# Patient Record
Sex: Male | Born: 1950 | Race: White | Hispanic: No | Marital: Married | Smoking: Never smoker
Health system: Southern US, Community
[De-identification: ages and names within clinical notes are randomized; demographics above are authoritative.]

## PROBLEM LIST (undated history)

## (undated) DIAGNOSIS — M419 Scoliosis, unspecified: Secondary | ICD-10-CM

## (undated) DIAGNOSIS — N529 Male erectile dysfunction, unspecified: Secondary | ICD-10-CM

## (undated) DIAGNOSIS — E669 Obesity, unspecified: Secondary | ICD-10-CM

## (undated) DIAGNOSIS — I471 Supraventricular tachycardia, unspecified: Secondary | ICD-10-CM

## (undated) DIAGNOSIS — E291 Testicular hypofunction: Secondary | ICD-10-CM

## (undated) HISTORY — DX: Supraventricular tachycardia: I47.1

## (undated) HISTORY — PX: NASAL SEPTUM SURGERY: SHX37

## (undated) HISTORY — PX: DENTAL SURGERY: SHX609

## (undated) HISTORY — DX: Supraventricular tachycardia, unspecified: I47.10

## (undated) HISTORY — DX: Obesity, unspecified: E66.9

## (undated) HISTORY — DX: Scoliosis, unspecified: M41.9

## (undated) HISTORY — DX: Male erectile dysfunction, unspecified: N52.9

## (undated) HISTORY — DX: Testicular hypofunction: E29.1

---

## 2000-08-30 ENCOUNTER — Other Ambulatory Visit: Admission: RE | Admit: 2000-08-30 | Discharge: 2000-08-30 | Payer: Self-pay | Admitting: Gastroenterology

## 2010-10-23 ENCOUNTER — Encounter: Payer: Self-pay | Admitting: Gastroenterology

## 2013-05-17 HISTORY — PX: COLONOSCOPY: SHX174

## 2013-08-03 ENCOUNTER — Encounter: Payer: Self-pay | Admitting: Gastroenterology

## 2013-09-13 ENCOUNTER — Encounter: Payer: Self-pay | Admitting: Gastroenterology

## 2013-10-22 ENCOUNTER — Ambulatory Visit (AMBULATORY_SURGERY_CENTER): Payer: Self-pay

## 2013-10-22 VITALS — Ht 72.0 in | Wt 231.0 lb

## 2013-10-22 DIAGNOSIS — Z860100 Personal history of colon polyps, unspecified: Secondary | ICD-10-CM

## 2013-10-22 MED ORDER — SUPREP BOWEL PREP KIT 17.5-3.13-1.6 GM/177ML PO SOLN: 1.0000 | Freq: Once | ORAL | Status: DC

## 2013-10-22 NOTE — Progress Notes (Signed)
No allergies to eggs or soy No home oxygen No diet/weight loss meds No past problems with anesthesia  Has email  Emmi instructions given for colonoscopy 

## 2013-10-25 ENCOUNTER — Encounter: Payer: Self-pay | Admitting: Gastroenterology

## 2013-11-05 ENCOUNTER — Encounter: Payer: Self-pay | Admitting: Gastroenterology

## 2013-11-05 ENCOUNTER — Ambulatory Visit (AMBULATORY_SURGERY_CENTER): Payer: BC Managed Care – PPO | Admitting: Gastroenterology

## 2013-11-05 VITALS — BP 113/73 | HR 74 | Temp 97.9°F | Resp 26 | Ht 72.0 in | Wt 231.0 lb

## 2013-11-05 DIAGNOSIS — Z1211 Encounter for screening for malignant neoplasm of colon: Secondary | ICD-10-CM

## 2013-11-05 DIAGNOSIS — K5730 Diverticulosis of large intestine without perforation or abscess without bleeding: Secondary | ICD-10-CM

## 2013-11-05 DIAGNOSIS — Z86010 Personal history of colonic polyps: Secondary | ICD-10-CM

## 2013-11-05 MED ORDER — SODIUM CHLORIDE 0.9 % IV SOLN: 500.0000 mL | INTRAVENOUS | Status: DC

## 2013-11-05 NOTE — Op Note (Signed)
[facility] [address] [place], [id number]   COLONOSCOPY PROCEDURE REPORT  PATIENT: Eric Sullivan, Eric W.  MR#: [id number] BIRTHDATE: 1950/06/24 , 63  yrs. old GENDER: Male ENDOSCOPIST: Louis Meckelobert D Kaplan, MD REFERRED BM:WUXLKBY:Edwin Chilton SiGreen, M.D. PROCEDURE DATE:  11/05/2013 PROCEDURE:   Colonoscopy, diagnostic First Screening Colonoscopy - Avg.  risk and is 50 yrs.  old or older - No.  Prior Negative Screening - Now for repeat screening. 10 or more years since last screening  History of Adenoma - Now for follow-up colonoscopy & has been > or = to 3 yrs.  Yes hx of adenoma.  Has been 3 or more years since last colonoscopy.  Polyps Removed Today? No.  Recommend repeat exam, <10 yrs? No. ASA CLASS:   Class II INDICATIONS:Patient's personal history of colon polyps 2002.  2005 colonoscopy negative for polyps MEDICATIONS: MAC sedation, administered by CRNA and propofol (Diprivan) 300mg  IV  DESCRIPTION OF PROCEDURE:   After the risks benefits and alternatives of the procedure were thoroughly explained, informed consent was obtained.  A digital rectal exam revealed no abnormalities of the rectum.   The LB [id number]  endoscope was introduced through the anus and advanced to the terminal ileum which was intubated for a short distance. No adverse events experienced.   The quality of the prep was excellent using Suprep The instrument was then slowly withdrawn as the colon was fully examined.      COLON FINDINGS: Mild diverticulosis was noted in the sigmoid colon. The mucosa appeared normal in the terminal ileum.   A normal appearing cecum, ileocecal valve, and appendiceal orifice were identified.  The ascending, hepatic flexure, transverse, splenic flexure, descending, sigmoid colon and rectum appeared unremarkable.  No polyps or cancers were seen.  Retroflexed views revealed no abnormalities. The time to cecum=6 minutes 11 seconds. Withdrawal time=7 minutes 20 seconds.   The scope was withdrawn and the procedure completed. COMPLICATIONS: There were no complications.  ENDOSCOPIC IMPRESSION: 1.   Mild diverticulosis was noted in the sigmoid colon 2.   Normal mucosa in the terminal ileum 3.   Normal colon  RECOMMENDATIONS: Colonoscopy 10 years   eSigned:  Louis Meckelobert D Kaplan, MD 11/05/2013 9:38 AM   cc:   PATIENT NAME:  Eric Sullivan, Eric W. MR#: [id number]

## 2013-11-05 NOTE — Progress Notes (Signed)
Procedure ends, to recovery, report given and VSS. 

## 2013-11-05 NOTE — Patient Instructions (Signed)
Discharge instructions given with verbal understanding. Handouts on diverticulosis and a high fiber diet. Resume previous medications. YOU HAD AN ENDOSCOPIC PROCEDURE TODAY AT THE [facility]: Refer to the procedure report that was given to you for any specific questions about what was found during the examination.  If the procedure report does not answer your questions, please call your gastroenterologist to clarify.  If you requested that your care partner not be given the details of your procedure findings, then the procedure report has been included in a sealed envelope for you to review at your convenience later.  YOU SHOULD EXPECT: Some feelings of bloating in the abdomen. Passage of more gas than usual.  Walking can help get rid of the air that was put into your GI tract during the procedure and reduce the bloating. If you had a lower endoscopy (such as a colonoscopy or flexible sigmoidoscopy) you may notice spotting of blood in your stool or on the toilet paper. If you underwent a bowel prep for your procedure, then you may not have a normal bowel movement for a few days.  DIET: Your first meal following the procedure should be a light meal and then it is ok to progress to your normal diet.  A half-sandwich or bowl of soup is an example of a good first meal.  Heavy or fried foods are harder to digest and may make you feel nauseous or bloated.  Likewise meals heavy in dairy and vegetables can cause extra gas to form and this can also increase the bloating.  Drink plenty of fluids but you should avoid alcoholic beverages for 24 hours.  ACTIVITY: Your care partner should take you home directly after the procedure.  You should plan to take it easy, moving slowly for the rest of the day.  You can resume normal activity the day after the procedure however you should NOT DRIVE or use heavy machinery for 24 hours (because of the sedation medicines used during the test).    SYMPTOMS TO REPORT  IMMEDIATELY: A gastroenterologist can be reached at any hour.  During normal business hours, 8:30 AM to 5:00 PM Monday through Friday, call [phone].  After hours and on weekends, please call the GI answering service at [phone] who will take a message and have the physician on call contact you.   Following lower endoscopy (colonoscopy or flexible sigmoidoscopy):  Excessive amounts of blood in the stool  Significant tenderness or worsening of abdominal pains  Swelling of the abdomen that is new, acute  Fever of 100F or higher FOLLOW UP: If any biopsies were taken you will be contacted by phone or by letter within the next 1-3 weeks.  Call your gastroenterologist if you have not heard about the biopsies in 3 weeks.  Our staff will call the home number listed on your records the next business day following your procedure to check on you and address any questions or concerns that you may have at that time regarding the information given to you following your procedure. This is a courtesy call and so if there is no answer at the home number and we have not heard from you through the emergency physician on call, we will assume that you have returned to your regular daily activities without incident.  SIGNATURES/CONFIDENTIALITY: You and/or your care partner have signed paperwork which will be entered into your electronic medical record.  These signatures attest to the fact that that the information above on your After   Visit Summary has been reviewed and is understood.  Full responsibility of the confidentiality of this discharge information lies with you and/or your care-partner. 

## 2013-11-06 ENCOUNTER — Telehealth: Payer: Self-pay | Admitting: *Deleted

## 2013-11-06 NOTE — Telephone Encounter (Signed)
  Follow up Call-  Call back number 11/05/2013  Post procedure Call Back phone  # [phone]  Permission to leave phone message Yes     Patient questions:  Do you have a fever, pain , or abdominal swelling? no Pain Score  0 *  Have you tolerated food without any problems? yes  Have you been able to return to your normal activities? yes  Do you have any questions about your discharge instructions: Diet   no Medications  no Follow up visit  no  Do you have questions or concerns about your Care? no  Actions: * If pain score is 4 or above: No action needed, pain <4.

## 2016-06-07 ENCOUNTER — Other Ambulatory Visit: Payer: Self-pay | Admitting: Orthopedic Surgery

## 2016-06-07 DIAGNOSIS — M25511 Pain in right shoulder: Secondary | ICD-10-CM

## 2016-06-13 ENCOUNTER — Ambulatory Visit
Admission: RE | Admit: 2016-06-13 | Discharge: 2016-06-13 | Disposition: A | Discharge status: Home / Self Care | Payer: BLUE CROSS/BLUE SHIELD | Source: Ambulatory Visit | Attending: Orthopedic Surgery | Admitting: Orthopedic Surgery

## 2016-06-13 DIAGNOSIS — M25511 Pain in right shoulder: Secondary | ICD-10-CM

## 2016-10-26 ENCOUNTER — Ambulatory Visit (INDEPENDENT_AMBULATORY_CARE_PROVIDER_SITE_OTHER): Payer: Medicare Other

## 2016-10-26 ENCOUNTER — Other Ambulatory Visit: Payer: Self-pay | Admitting: Internal Medicine

## 2016-10-26 DIAGNOSIS — R52 Pain, unspecified: Secondary | ICD-10-CM

## 2016-10-26 DIAGNOSIS — R002 Palpitations: Secondary | ICD-10-CM | POA: Diagnosis not present

## 2016-11-08 ENCOUNTER — Other Ambulatory Visit: Payer: Medicare Other

## 2018-03-13 IMAGING — MR MR SHOULDER*R* W/O CM
5 series · 36 of 40 positions shown · non-contrast
Comparison: None.

CLINICAL DATA: Right shoulder pain for 20 years increasing over the
past month.

EXAM:
MRI OF THE RIGHT SHOULDER WITHOUT CONTRAST
TECHNIQUE: Multiplanar, multisequence MR imaging of the shoulder was performed.
No intravenous contrast was administered.

[Series 5: T2 fat-sat · axial · 4.0mm · 0.44mm/px · z∈[-20,+53]mm · 9 of 18 slices shown (1 of 3)]
[im 1/18]
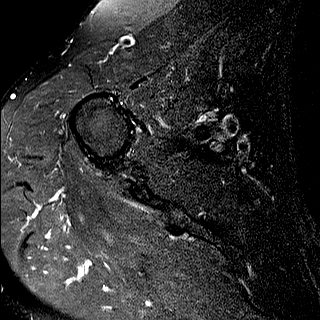
[im 3/18]
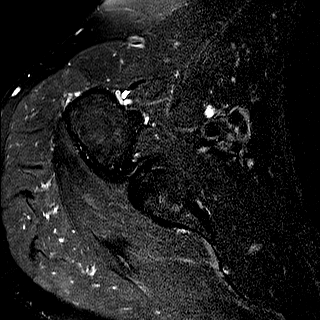
[im 5/18]
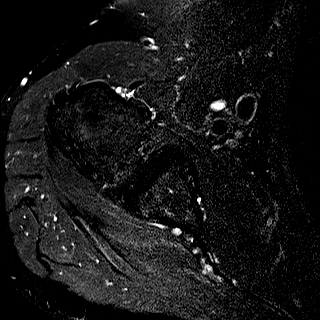
[im 7/18]
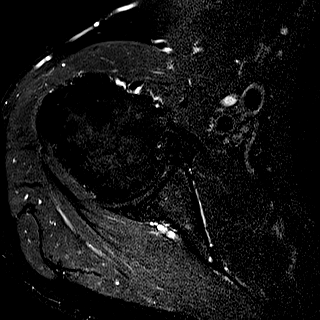
[im 9/18]
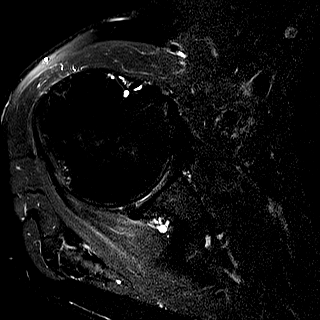
[im 11/18]
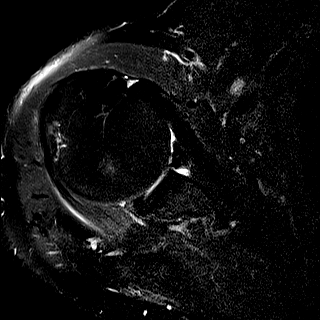
[im 13/18]
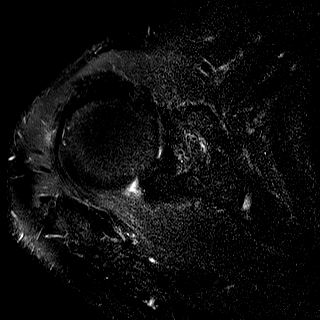
[im 15/18]
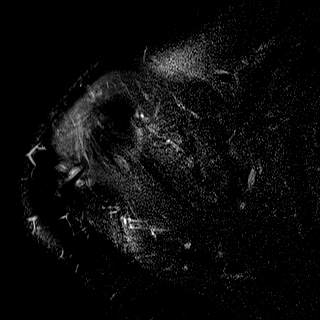
[im 18/18]
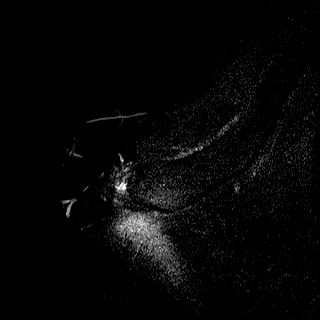

[Series 6: T2 fat-sat · oblique · 4.0mm · 0.50mm/px · 8 of 16 slices shown (2 of 3)]
[im 1/16]
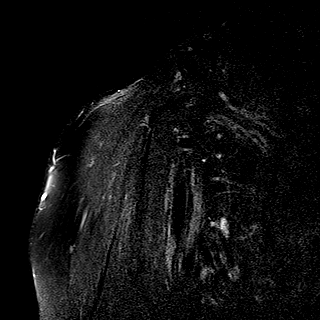
[im 3/16]
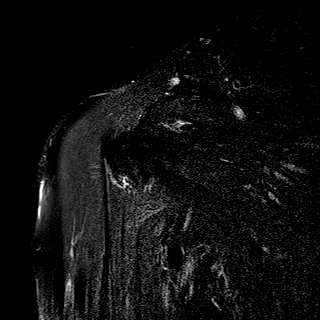
[im 5/16]
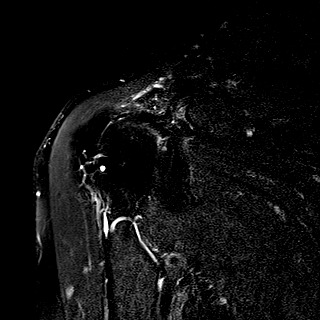
[im 7/16]
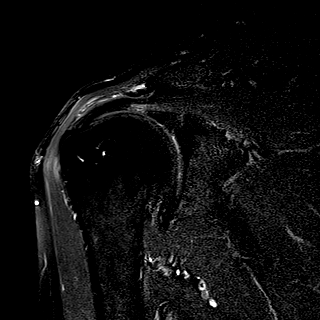
[im 9/16]
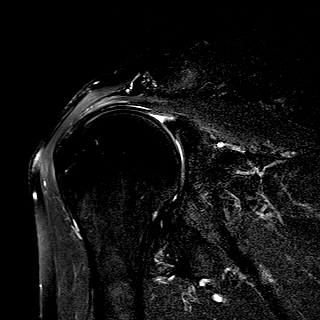
[im 11/16]
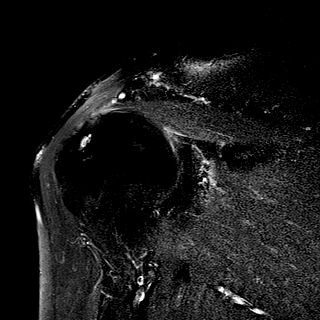
[im 13/16]
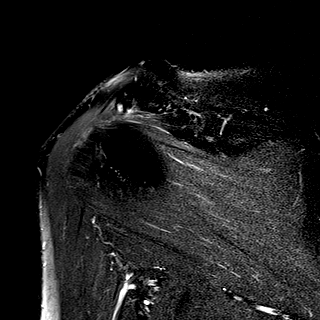
[im 16/16]
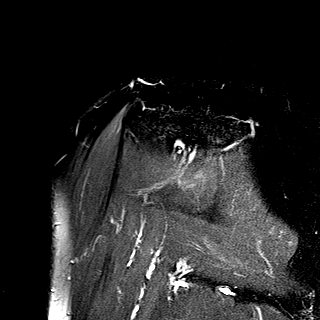

[Series 7: PD · oblique · 4.0mm · 0.50mm/px · 7 of 16 slices shown]
[im 1/16]
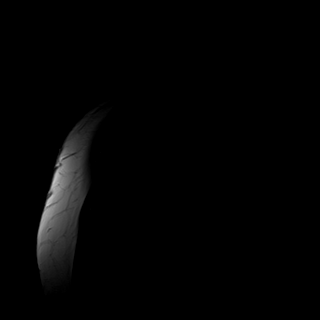
[im 3/16]
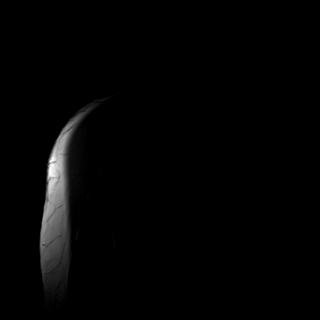
[im 6/16]
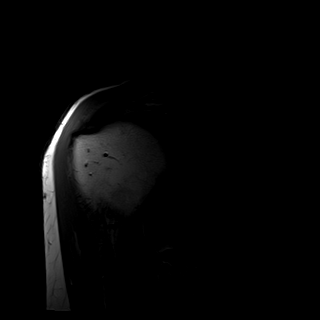
[im 8/16]
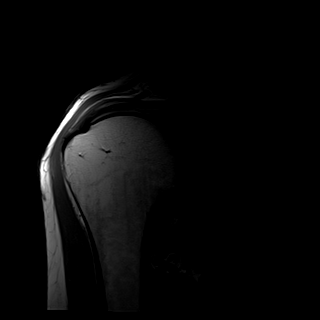
[im 11/16]
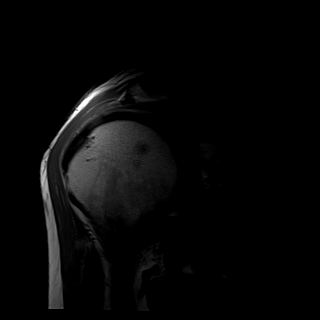
[im 13/16]
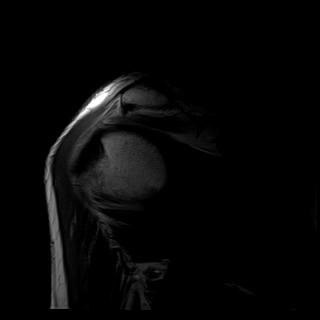
[im 16/16]
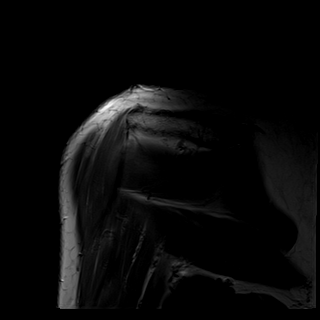

[Series 8: T2 fat-sat · coronal · 4.0mm · 0.50mm/px · 8 of 18 slices shown (3 of 3)]
[im 1/18]
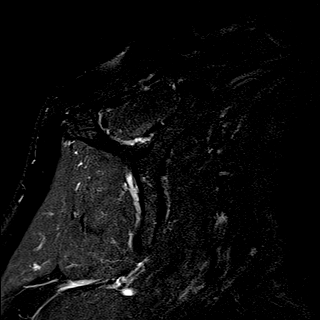
[im 3/18]
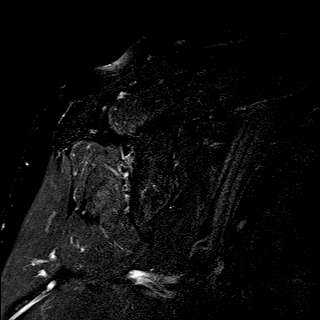
[im 5/18]
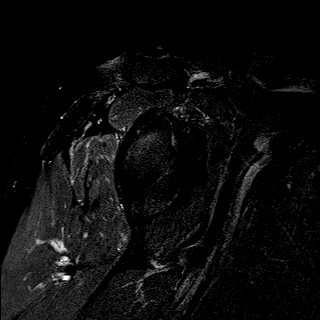
[im 8/18]
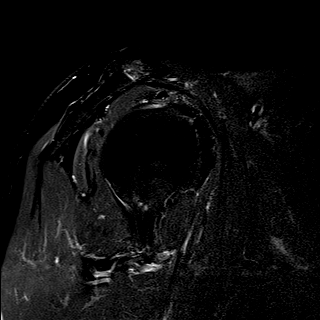
[im 10/18]
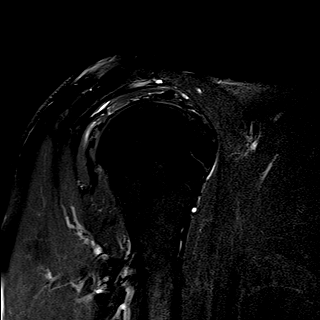
[im 13/18]
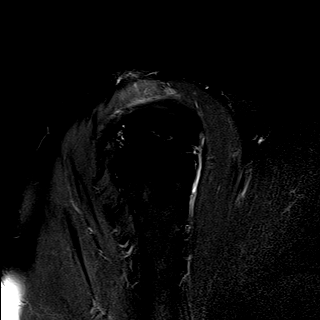
[im 15/18]
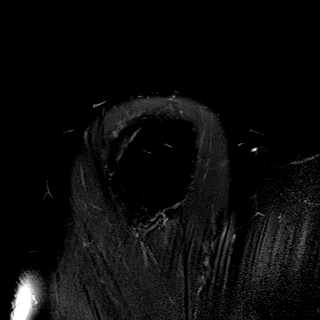
[im 18/18]
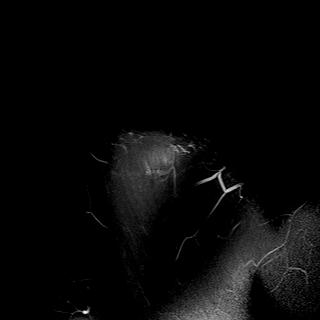

[Series 9: T1 · coronal · 4.0mm · 0.25mm/px · 4 of 18 slices shown]
[im 1/18]
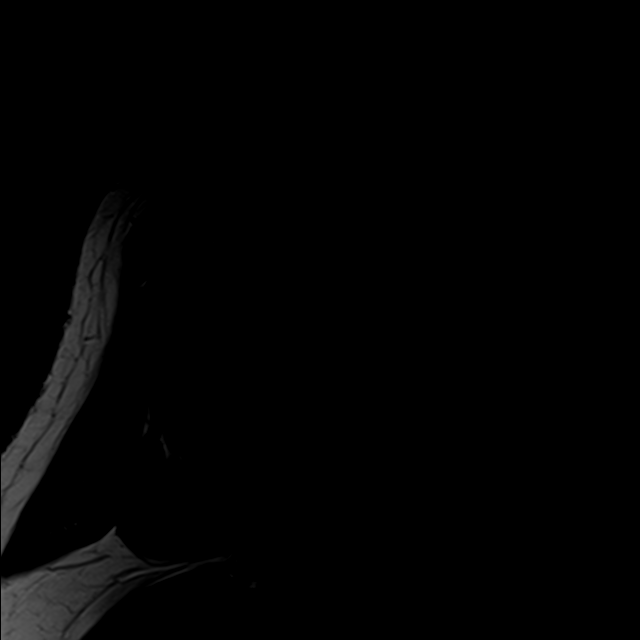
[im 3/18]
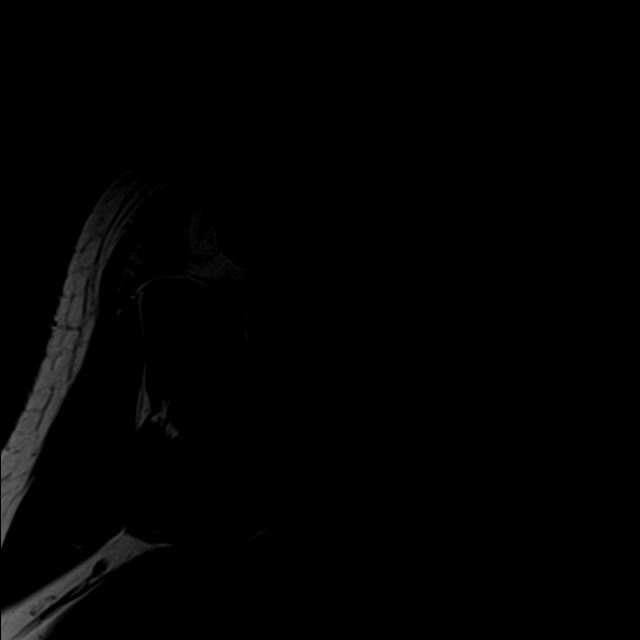
[im 5/18]
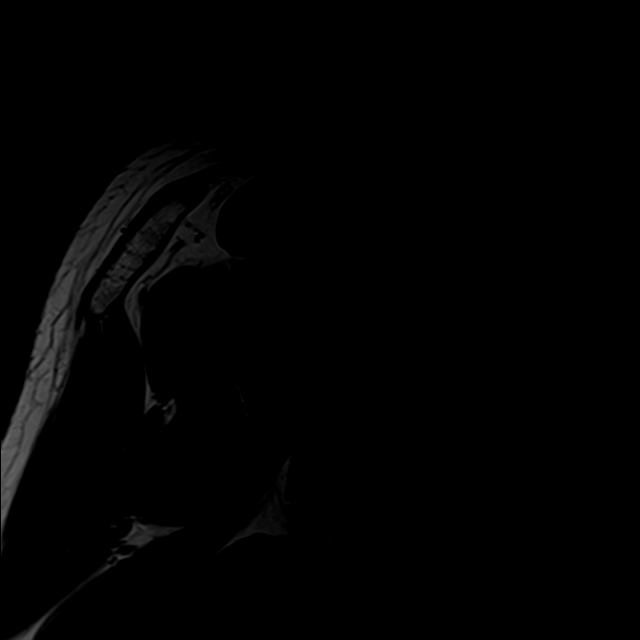
[im 8/18]
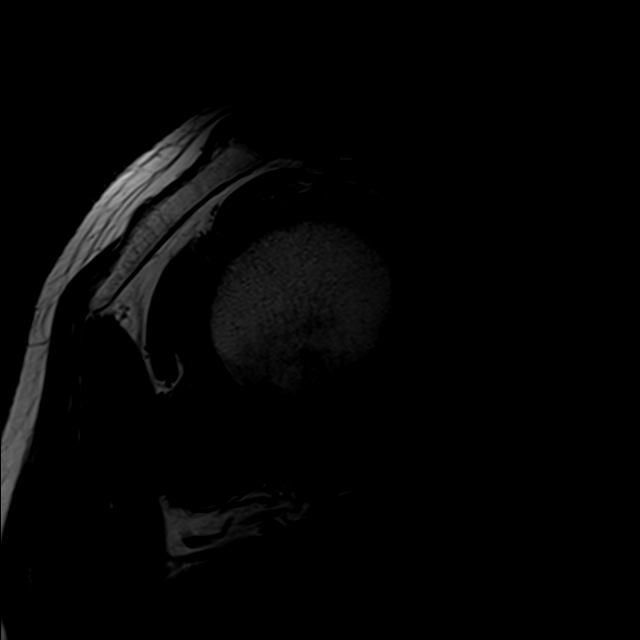

[36 of 40 positions shown; findings below may reference images not displayed]

FINDINGS: Rotator cuff: Moderate tendinopathy and questionable mild bursal
surface partial thickness tearing of the supraspinatus and
infraspinatus.

Muscles:  Unremarkable

Biceps long head:  Unremarkable

Acromioclavicular Joint: Low-grade edema in the AC joint without
significant spurring. Type II acromion.

Glenohumeral Joint: Moderate degenerative chondral thinning in the
glenohumeral joint. Small degenerative subcortical lesion
posteriorly along the anatomic neck of the humerus.

Labrum:  Grossly unremarkable

Bones: No significant extra-articular osseous abnormalities
identified.

Other: No supplemental non-categorized findings.
IMPRESSION: 1. Moderate tendinopathy of the supraspinatus and infraspinatus
potentially with minimal partial thickness bursal surface tearing of
both tendons, but no full-thickness tear is identified.
2. Moderate degenerative chondral thinning in the glenohumeral
joint.

## 2020-02-21 ENCOUNTER — Other Ambulatory Visit: Payer: Medicare Other

## 2020-02-21 DIAGNOSIS — Z20822 Contact with and (suspected) exposure to covid-19: Secondary | ICD-10-CM

## 2020-02-23 LAB — SARS-COV-2, NAA 2 DAY TAT

## 2020-02-23 LAB — NOVEL CORONAVIRUS, NAA: SARS-CoV-2, NAA: NOT DETECTED

## 2021-07-13 ENCOUNTER — Other Ambulatory Visit: Payer: Self-pay

## 2021-07-13 ENCOUNTER — Encounter: Payer: Self-pay | Admitting: Cardiology

## 2021-07-13 ENCOUNTER — Ambulatory Visit (INDEPENDENT_AMBULATORY_CARE_PROVIDER_SITE_OTHER): Payer: Medicare Other | Admitting: Cardiology

## 2021-07-13 VITALS — BP 118/78 | HR 104 | Ht 71.0 in | Wt 226.0 lb

## 2021-07-13 DIAGNOSIS — I471 Supraventricular tachycardia: Secondary | ICD-10-CM | POA: Diagnosis not present

## 2021-07-13 NOTE — Patient Instructions (Signed)
Medication Instructions:  Your physician recommends that you continue on your current medications as directed. Please refer to the Current Medication list given to you today.  *If you need a refill on your cardiac medications before your next appointment, please call your pharmacy*   Lab Work: None ordered   Testing/Procedures: None ordered   Follow-Up: At [facility], you and your health needs are our priority.  As part of our continuing mission to provide you with exceptional heart care, we have created designated Provider Care Teams.  These Care Teams include your primary Cardiologist (physician) and Advanced Practice Providers (APPs -  Physician Assistants and Nurse Practitioners) who all work together to provide you with the care you need, when you need it.  Your next appointment:   3 month(s)  The format for your next appointment:   In Person  Provider:   Will Camnitz, MD    Thank you for choosing [facility]!!   Nyx Keady, RN [phone]     

## 2021-07-13 NOTE — Progress Notes (Signed)
Electrophysiology Office Note   Date:  07/13/2021   ID:  Eric Sullivan, Boxx [birth year], MRN [id number]  PCP:  Eric Nephew, MD  Cardiologist:   Primary Electrophysiologist:  Eric Roehm Jorja Loa, MD    Chief Complaint: SVT   History of Present Illness: Eric Sullivan is a 71 y.o. male who is being seen today for the evaluation of SVT at the request of Eric Sullivan, Eric Koh, MD. Presenting today for electrophysiology evaluation.  He is a retired Therapist, sports. He presents today with complaints of SVT.  He has been having palpitations for many years.  He feels palpitations but does not have shortness of breath or fatigue.  He is generally able to do all of his daily activities despite his palpitations.  Last year, he was having a few episodes a year, but he is now having them multiple times a week.  He states that he gets rapid heart rates, and then when the rhythm terminates, he is in sinus tachycardia.  He also has episodes of irregular heart rates that he feels are rapid.  He does have a cardia mobile which she reports these episodes.  Today, he denies symptoms of palpitations, chest pain, shortness of breath, orthopnea, PND, lower extremity edema, claudication, dizziness, presyncope, syncope, bleeding, or neurologic sequela. The patient is tolerating medications without difficulties.    Past Medical History:  Diagnosis Date   Erectile dysfunction    Obesity    SVT (supraventricular tachycardia) (HCC)    Testicular hypofunction    Past Surgical History:  Procedure Laterality Date   DENTAL SURGERY     NASAL SEPTUM SURGERY       Current Outpatient Medications  Medication Sig Dispense Refill   diltiazem (DILACOR XR) 180 MG 24 hr capsule as needed.     sildenafil (REVATIO) 20 MG tablet as needed.     testosterone (ANDROGEL) 50 MG/5GM (1%) GEL Place 5 g onto the skin daily.     No current facility-administered medications for this visit.    Allergies:   Patient has no known  allergies.   Social History:  The patient  reports that he has never smoked. He has never used smokeless tobacco. He reports current alcohol use. He reports that he does not use drugs.   Family History:  The patient's family history includes Colon cancer in his maternal grandfather; Pancreatic cancer in his mother.    ROS:  Please see the history of present illness.   Otherwise, review of systems is positive for none.   All other systems are reviewed and negative.    PHYSICAL EXAM: VS:  BP 118/78    Pulse (!) 104    Ht 5\' 11"  (1.803 m)    Wt 226 lb (102.5 kg)    SpO2 95%    BMI 31.52 kg/m  , BMI Body mass index is 31.52 kg/m. GEN: Well nourished, well developed, in no acute distress  HEENT: normal  Neck: no JVD, carotid bruits, or masses Cardiac: RRR; no murmurs, rubs, or gallops,no edema  Respiratory:  clear to auscultation bilaterally, normal work of breathing GI: soft, nontender, nondistended, + BS MS: no deformity or atrophy  Skin: warm and dry Neuro:  Strength and sensation are intact Psych: euthymic mood, full affect  EKG:  EKG is ordered today. Personal review of the ekg ordered shows sinus rhythm, rate 104  Recent Labs: No results found for requested labs within last 8760 hours.    Lipid Panel  No results found for:  CHOL, TRIG, HDL, CHOLHDL, VLDL, LDLCALC, LDLDIRECT   Wt Readings from Last 3 Encounters:  07/13/21 226 lb (102.5 kg)  11/05/13 231 lb (104.8 kg)  10/22/13 231 lb (104.8 kg)      Other studies Reviewed: Additional studies/ records that were reviewed today include: PCP notes  ASSESSMENT AND PLAN:  1.  SVT: Appears due to atrial tachycardia based on his recordings from his cardia mobile monitor.  He also has quite a few PACs and he feels the irregularity.  He is not overly concerned other than he wants to make sure that this is not a major issue for him which he does not.  He Eric Sullivan start taking his diltiazem to see if that improves his  symptoms.    Current medicines are reviewed at length with the patient today.   The patient does not have concerns regarding his medicines.  The following changes were made today:  none  Labs/ tests ordered today include:  Orders Placed This Encounter  Procedures   EKG 12-Lead     Disposition:   FU with Eric Sullivan 91m months  Signed, Fayrene Towner Jorja Loa, MD  07/13/2021 2:24 PM     [facility] [address] [place] [address] [phone] (office) [phone] (fax)

## 2021-10-15 ENCOUNTER — Ambulatory Visit (INDEPENDENT_AMBULATORY_CARE_PROVIDER_SITE_OTHER): Payer: Medicare Other | Admitting: Cardiology

## 2021-10-15 ENCOUNTER — Encounter: Payer: Self-pay | Admitting: Cardiology

## 2021-10-15 VITALS — BP 112/70 | HR 92 | Ht 71.0 in | Wt 217.0 lb

## 2021-10-15 DIAGNOSIS — I471 Supraventricular tachycardia: Secondary | ICD-10-CM

## 2021-10-15 NOTE — Progress Notes (Signed)
Electrophysiology Office Note   Date:  10/15/2021   ID:  Lorrin, Nawrot 1950/09/28, MRN [id number]  PCP:  Gaspar Garbe, MD  Cardiologist:   Primary Electrophysiologist:  Marque Rademaker Jorja Loa, MD    Chief Complaint: SVT   History of Present Illness: Eric Sullivan is a 71 y.o. male who is being seen today for the evaluation of SVT at the request of Nila Nephew, MD. Presenting today for electrophysiology evaluation.  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  He has been having palpitations for many years.  He he does not have shortness of breath or fatigue.  He is able to do all of his daily activities.  He at times get rapid heart rates and when the rhythm terminates he is in sinus tachycardia.  He also has episodes of irregular heart rates that he feels are rapid.  He has a cardia mobile which is shown likely atrial tachycardia.  Today, denies symptoms of palpitations, chest pain, shortness of breath, orthopnea, PND, lower extremity edema, claudication, dizziness, presyncope, syncope, bleeding, or neurologic sequela. The patient is tolerating medications without difficulties.  Since being seen he has done well.  He has had 1 episode of SVT since starting the diltiazem.  He is overall quite happy with his control.  He is minimally symptomatic.   Past Medical History:  Diagnosis Date   Erectile dysfunction    Obesity    SVT (supraventricular tachycardia) (HCC)    Testicular hypofunction    Past Surgical History:  Procedure Laterality Date   DENTAL SURGERY     NASAL SEPTUM SURGERY       Current Outpatient Medications  Medication Sig Dispense Refill   diltiazem (DILACOR XR) 180 MG 24 hr capsule Take 180 mg by mouth daily.     Glucosamine HCl (GLUCOSAMINE PO) Take 1 tablet by mouth daily.     sildenafil (REVATIO) 20 MG tablet as needed.     testosterone (ANDROGEL) 50 MG/5GM (1%) GEL Place 5 g onto the skin daily.     No current facility-administered  medications for this visit.    Allergies:   Patient has no known allergies.   Social History:  The patient  reports that he has never smoked. He has never used smokeless tobacco. He reports current alcohol use. He reports that he does not use drugs.   Family History:  The patient's family history includes Colon cancer in his maternal grandfather; Pancreatic cancer in his mother.    ROS:  Please see the history of present illness.   Otherwise, review of systems is positive for none.   All other systems are reviewed and negative.   PHYSICAL EXAM: VS:  BP 112/70   Pulse 92   Ht 5\' 11"  (1.803 m)   Wt 217 lb (98.4 kg)   SpO2 96%   BMI 30.27 kg/m  , BMI Body mass index is 30.27 kg/m. GEN: Well nourished, well developed, in no acute distress  HEENT: normal  Neck: no JVD, carotid bruits, or masses Cardiac: RRR; no murmurs, rubs, or gallops,no edema  Respiratory:  clear to auscultation bilaterally, normal work of breathing GI: soft, nontender, nondistended, + BS MS: no deformity or atrophy  Skin: warm and dry Neuro:  Strength and sensation are intact Psych: euthymic mood, full affect  EKG:  EKG is not ordered today. Personal review of the ekg ordered 07/13/21 shows sinus rhythm, rate 104   Recent Labs: No results found  for requested labs within last 8760 hours.    Lipid Panel  No results found for: CHOL, TRIG, HDL, CHOLHDL, VLDL, LDLCALC, LDLDIRECT   Wt Readings from Last 3 Encounters:  10/15/21 217 lb (98.4 kg)  07/13/21 226 lb (102.5 kg)  11/05/13 231 lb (104.8 kg)      Other studies Reviewed: Additional studies/ records that were reviewed today include: PCP notes  ASSESSMENT AND PLAN:  1.  SVT: Appears due to atrial tachycardia based on readings from cardia mobile.  He has quite a few PACs and feels irregularity.  Currently on diltiazem 180 mg daily.  Starting the diltiazem he has done well with only 1 episode of SVT.  He is overall quite happy with his control.  We  Dollie Bressi continue with current management.  Current medicines are reviewed at length with the patient today.   The patient does not have concerns regarding his medicines.  The following changes were made today: None  Labs/ tests ordered today include:  No orders of the defined types were placed in this encounter.    Disposition:   FU 12 months  Signed, Taurus Alamo Jorja Loa, MD  10/15/2021 12:17 PM     [facility] [address] [place] [address] [phone] (office) [phone] (fax)

## 2022-07-14 ENCOUNTER — Other Ambulatory Visit: Payer: Self-pay | Admitting: Internal Medicine

## 2022-07-14 DIAGNOSIS — E7800 Pure hypercholesterolemia, unspecified: Secondary | ICD-10-CM

## 2022-09-01 ENCOUNTER — Ambulatory Visit
Admission: RE | Admit: 2022-09-01 | Discharge: 2022-09-01 | Disposition: A | Discharge status: Home / Self Care | Payer: No Typology Code available for payment source | Source: Ambulatory Visit | Attending: Internal Medicine | Admitting: Internal Medicine

## 2022-09-01 DIAGNOSIS — E7800 Pure hypercholesterolemia, unspecified: Secondary | ICD-10-CM

## 2023-08-19 ENCOUNTER — Encounter: Payer: Self-pay | Admitting: Gastroenterology

## 2023-10-13 ENCOUNTER — Ambulatory Visit (AMBULATORY_SURGERY_CENTER)

## 2023-10-13 VITALS — Ht 71.0 in | Wt 192.0 lb

## 2023-10-13 DIAGNOSIS — Z1211 Encounter for screening for malignant neoplasm of colon: Secondary | ICD-10-CM

## 2023-10-13 MED ORDER — NA SULFATE-K SULFATE-MG SULF 17.5-3.13-1.6 GM/177ML PO SOLN: 1.0000 | Freq: Once | ORAL | 0 refills | Status: AC

## 2023-10-13 NOTE — Progress Notes (Signed)
 Pre visit completed via phone call; Patient verified name, DOB, and address; No egg or soy allergy known to patient;  No issues known to pt with past sedation with any surgeries or procedures; Patient denies ever being told they had issues or difficulty with intubation;  No FH of Malignant Hyperthermia; Pt is not on diet pills; Pt is not on home 02; Pt is not on blood thinners;  Pt denies issues with constipation;  No A fib or A flutter; Have any cardiac testing pending--NO Insurance verified during PV appt--- Medicare A/B Pt can ambulate without assistance;  Pt denies use of chewing tobacco; Discussed diabetic/weight loss medication holds; Discussed NSAID holds; Checked BMI to be less than 50; Pt instructed to use Single[employer]com or GoodRx for a price reduction on prep;  Patient's chart reviewed by Rogena Class CNRA prior to previsit and patient appropriate for the [facility].  Pre visit completed and red dot placed by patient's name on their procedure day (on provider's schedule).    Instructions sent to patient via MyChart per his request;

## 2023-10-14 ENCOUNTER — Encounter

## 2023-11-04 ENCOUNTER — Ambulatory Visit: Admitting: Gastroenterology

## 2023-11-04 ENCOUNTER — Encounter: Payer: Self-pay | Admitting: Gastroenterology

## 2023-11-04 VITALS — BP 116/69 | HR 81 | Temp 97.3°F | Resp 16 | Ht 71.0 in | Wt 192.0 lb

## 2023-11-04 DIAGNOSIS — K640 First degree hemorrhoids: Secondary | ICD-10-CM

## 2023-11-04 DIAGNOSIS — D122 Benign neoplasm of ascending colon: Secondary | ICD-10-CM

## 2023-11-04 DIAGNOSIS — Z1211 Encounter for screening for malignant neoplasm of colon: Secondary | ICD-10-CM | POA: Diagnosis not present

## 2023-11-04 DIAGNOSIS — K5730 Diverticulosis of large intestine without perforation or abscess without bleeding: Secondary | ICD-10-CM

## 2023-11-04 DIAGNOSIS — D120 Benign neoplasm of cecum: Secondary | ICD-10-CM | POA: Diagnosis not present

## 2023-11-04 DIAGNOSIS — D124 Benign neoplasm of descending colon: Secondary | ICD-10-CM | POA: Diagnosis not present

## 2023-11-04 MED ORDER — SODIUM CHLORIDE 0.9 % IV SOLN: 500.0000 mL | INTRAVENOUS | Status: DC

## 2023-11-04 NOTE — Progress Notes (Signed)
 Called to room to assist during endoscopic procedure.  Patient ID and intended procedure confirmed with present staff. Received instructions for my participation in the procedure from the performing physician.

## 2023-11-04 NOTE — Progress Notes (Signed)
 [facility] History and Physical   Primary Care Physician:  Tisovec, Kristina Pfeiffer, MD   Reason for Procedure:   CRC screening  Plan:     colonoscopy     HPI: Eric Sullivan is a 73 y.o. male    Past Medical History:  Diagnosis Date   Erectile dysfunction    Obesity    Scoliosis    SVT (supraventricular tachycardia) (HCC)    Testicular hypofunction     Past Surgical History:  Procedure Laterality Date   COLONOSCOPY  2015   RK-MAC-suprep(exc)-tics/normal-10 yr recall   DENTAL SURGERY     NASAL SEPTUM SURGERY      Prior to Admission medications   Medication Sig Start Date End Date Taking? Authorizing Provider  glucosamine-chondroitin 500-400 MG tablet Take 1 tablet by mouth daily.   Yes [provider]  LYSINE PO Take 1,000 mg by mouth daily at 6 (six) AM.   Yes [provider]  Testosterone (ANDROGEL PUMP) 20.25 MG/ACT (1.62%) GEL Apply 1.62 % topically daily. 01/26/23  Yes [provider]  diltiazem (CARDIZEM SR) 120 MG 12 hr capsule Take 120 mg by mouth daily as needed.    [provider]  sildenafil (VIAGRA) 100 MG tablet Take 100 mg by mouth as needed.    [provider]    Current Outpatient Medications  Medication Sig Dispense Refill   glucosamine-chondroitin 500-400 MG tablet Take 1 tablet by mouth daily.     LYSINE PO Take 1,000 mg by mouth daily at 6 (six) AM.     Testosterone (ANDROGEL PUMP) 20.25 MG/ACT (1.62%) GEL Apply 1.62 % topically daily.     diltiazem (CARDIZEM SR) 120 MG 12 hr capsule Take 120 mg by mouth daily as needed.     sildenafil (VIAGRA) 100 MG tablet Take 100 mg by mouth as needed.     No current facility-administered medications for this visit.    Allergies as of 11/04/2023   (No Known Allergies)    Family History  Problem Relation Age of Onset   Pancreatic cancer Mother    Colon polyps Sister 46   Colon polyps Brother 18   Colon cancer Maternal Grandfather    Esophageal  cancer Neg Hx    Rectal cancer Neg Hx    Stomach cancer Neg Hx     Social History   Socioeconomic History   Marital status: Married    Spouse name: Not on file   Number of children: Not on file   Years of education: Not on file   Highest education level: Not on file  Occupational History   Not on file  Tobacco Use   Smoking status: Never   Smokeless tobacco: Never  Vaping Use   Vaping status: Never Used  Substance and Sexual Activity   Alcohol use: Yes    Alcohol/week: 4.0 standard drinks of alcohol    Types: 4 Standard drinks or equivalent per week    Comment: occ   Drug use: No   Sexual activity: Not on file  Other Topics Concern   Not on file  Social History Narrative   Not on file   Social Drivers of Health   Financial Resource Strain: Not on file  Food Insecurity: Not on file  Transportation Needs: Not on file  Physical Activity: Not on file  Stress: Not on file  Social Connections: Not on file  Intimate Partner Violence: Not on file    Review of Systems: Positive for none All other  review of systems negative except as mentioned in the HPI.  Physical Exam: Vital signs in last 24 hours: @VSRANGES @   General:   Alert,  Well-developed, well-nourished, pleasant and cooperative in NAD Lungs:  Clear throughout to auscultation.   Heart:  Regular rate and rhythm; no murmurs, clicks, rubs,  or gallops. Abdomen:  Soft, nontender and nondistended. Normal bowel sounds.   Neuro/Psych:  Alert and cooperative. Normal mood and affect. A and O x 3    No significant changes were identified.  The patient continues to be an appropriate candidate for the planned procedure and anesthesia.   Magnus Schuller, MD. [facility] 11/04/2023 1:55 PM@

## 2023-11-04 NOTE — Op Note (Signed)
 [facility] Patient Name: Eric Sullivan Procedure Date: 11/04/2023 10:20 AM MRN: [id number] Endoscopist: Lajuan Pila , MD, [id number] Age: 73 Referring MD:  Date of Birth: 01/13/1951 Gender: Male Account #: [id number] Procedure:                Colonoscopy Indications:              Screening for colorectal malignant neoplasm Medicines:                Monitored Anesthesia Care Procedure:                Pre-Anesthesia Assessment:                           - Prior to the procedure, a History and Physical                            was performed, and patient medications and                            allergies were reviewed. The patient's tolerance of                            previous anesthesia was also reviewed. The risks                            and benefits of the procedure and the sedation                            options and risks were discussed with the patient.                            All questions were answered, and informed consent                            was obtained. Prior Anticoagulants: The patient has                            taken no anticoagulant or antiplatelet agents. ASA                            Grade Assessment: II - A patient with mild systemic                            disease. After reviewing the risks and benefits,                            the patient was deemed in satisfactory condition to                            undergo the procedure.                           After obtaining informed consent, the colonoscope  was passed under direct vision. Throughout the                            procedure, the patient's blood pressure, pulse, and                            oxygen saturations were monitored continuously. The                            Olympus Scope CF [id number] was introduced through                            the anus and advanced to the 4 cm into the ileum.                            The colonoscopy  was performed without difficulty.                            The patient tolerated the procedure well. The                            quality of the bowel preparation was good. The                            terminal ileum, ileocecal valve, appendiceal                            orifice, and rectum were photographed. Scope In: 10:35:02 AM Scope Out: 10:53:23 AM Scope Withdrawal Time: 0 hours 13 minutes 41 seconds  Total Procedure Duration: 0 hours 18 minutes 21 seconds  Findings:                 Three sessile polyps were found in the proximal                            ascending colon, mid ascending colon and cecum. The                            polyps were 2 to 4 mm in size. These polyps were                            removed with a cold snare. Resection and retrieval                            were complete.                           A 4 mm polyp was found in the mid descending colon.                            The polyp was sessile. The polyp was removed with a  cold snare. Resection and retrieval were complete.                           Multiple medium-mouthed diverticula were found in                            the sigmoid colon and descending colon.                           Non-bleeding internal hemorrhoids were found during                            retroflexion. The hemorrhoids were moderate and                            Grade I (internal hemorrhoids that do not prolapse).                           The terminal ileum appeared normal.                           Retroflexion in the right colon was performed.                           The exam was otherwise without abnormality on                            direct and retroflexion views. Complications:            No immediate complications. Estimated Blood Loss:     Estimated blood loss: none. Impression:               - Three 2 to 4 mm polyps in the proximal ascending                            colon,  in the mid ascending colon and in the cecum,                            removed with a cold snare. Resected and retrieved.                           - One 4 mm polyp in the mid descending colon,                            removed with a cold snare. Resected and retrieved.                           - Moderate left colonic diverticulosis.                           - Non-bleeding internal hemorrhoids.                           - The examined portion of the ileum was normal.                           -  The examination was otherwise normal on direct                            and retroflexion views. Recommendation:           - Patient has a contact number available for                            emergencies. The signs and symptoms of potential                            delayed complications were discussed with the                            patient. Return to normal activities tomorrow.                            Written discharge instructions were provided to the                            patient.                           - High fiber diet.                           - Continue present medications.                           - Await pathology results.                           - Repeat colonoscopy for surveillance based on                            pathology results. Likely not needed d/t age.                           - The findings and recommendations were discussed                            with the patient's family. Lajuan Pila, MD 11/04/2023 10:59:28 AM This report has been signed electronically.

## 2023-11-04 NOTE — Progress Notes (Signed)
 Report to PACU, RN, vss, BBS= Clear.

## 2023-11-04 NOTE — Patient Instructions (Addendum)
-  Handout on polyps, high fiber diet, diverticulosis, and hemorrhoids  provided. -await pathology results. -repeat colonoscopy for surveillance recommended. Date to be determined when pathology result become available.  -Continue present medications.  YOU HAD AN ENDOSCOPIC PROCEDURE TODAY AT THE [facility]:   Refer to the procedure report that was given to you for any specific questions about what was found during the examination.  If the procedure report does not answer your questions, please call your gastroenterologist to clarify.  If you requested that your care partner not be given the details of your procedure findings, then the procedure report has been included in a sealed envelope for you to review at your convenience later.  YOU SHOULD EXPECT: Some feelings of bloating in the abdomen. Passage of more gas than usual.  Walking can help get rid of the air that was put into your GI tract during the procedure and reduce the bloating. If you had a lower endoscopy (such as a colonoscopy or flexible sigmoidoscopy) you may notice spotting of blood in your stool or on the toilet paper. If you underwent a bowel prep for your procedure, you may not have a normal bowel movement for a few days.  Please Note:  You might notice some irritation and congestion in your nose or some drainage.  This is from the oxygen used during your procedure.  There is no need for concern and it should clear up in a day or so.  SYMPTOMS TO REPORT IMMEDIATELY:  Following lower endoscopy (colonoscopy or flexible sigmoidoscopy):  Excessive amounts of blood in the stool  Significant tenderness or worsening of abdominal pains  Swelling of the abdomen that is new, acute  Fever of 100F or higher   For urgent or emergent issues, a gastroenterologist can be reached at any hour by calling [phone]. Do not use MyChart messaging for urgent concerns.    DIET:  We do recommend a small meal at first, but then  you may proceed to your regular diet.  Drink plenty of fluids but you should avoid alcoholic beverages for 24 hours.  ACTIVITY:  You should plan to take it easy for the rest of today and you should NOT DRIVE or use heavy machinery until tomorrow (because of the sedation medicines used during the test).    FOLLOW UP: Our staff will call the number listed on your records the next business day following your procedure.  We will call around 7:15- 8:00 am to check on you and address any questions or concerns that you may have regarding the information given to you following your procedure. If we do not reach you, we will leave a message.     If any biopsies were taken you will be contacted by phone or by letter within the next 1-3 weeks.  Please call us  at [phone] if you have not heard about the biopsies in 3 weeks.    SIGNATURES/CONFIDENTIALITY: You and/or your care partner have signed paperwork which will be entered into your electronic medical record.  These signatures attest to the fact that that the information above on your After Visit Summary has been reviewed and is understood.  Full responsibility of the confidentiality of this discharge information lies with you and/or your care-partner.

## 2023-11-04 NOTE — Progress Notes (Signed)
 Pt's states no medical or surgical changes since previsit or office visit.

## 2023-11-07 ENCOUNTER — Telehealth: Payer: Self-pay | Admitting: *Deleted

## 2023-11-07 NOTE — Telephone Encounter (Signed)
  Follow up Call-     11/04/2023   10:11 AM  Call back number  Post procedure Call Back phone  # [phone]  Permission to leave phone message Yes   Left message on machine to call back if any questions or concerns

## 2023-11-08 LAB — SURGICAL PATHOLOGY

## 2023-11-17 ENCOUNTER — Ambulatory Visit: Payer: Self-pay | Admitting: Gastroenterology
